# Patient Record
Sex: Male | Born: 1975 | Race: White | Hispanic: No | Marital: Single | Smoking: Never smoker
Health system: Southern US, Community
[De-identification: ages and names within clinical notes are randomized; demographics above are authoritative.]

## PROBLEM LIST (undated history)

## (undated) DIAGNOSIS — F32A Depression, unspecified: Secondary | ICD-10-CM

## (undated) DIAGNOSIS — F329 Major depressive disorder, single episode, unspecified: Secondary | ICD-10-CM

## (undated) HISTORY — PX: NASAL SEPTUM SURGERY: SHX37

## (undated) HISTORY — DX: Depression, unspecified: F32.A

## (undated) HISTORY — DX: Major depressive disorder, single episode, unspecified: F32.9

---

## 1998-12-23 ENCOUNTER — Ambulatory Visit: Admission: RE | Admit: 1998-12-23 | Discharge: 1998-12-23 | Payer: Self-pay | Admitting: Otolaryngology

## 1999-01-29 ENCOUNTER — Encounter: Payer: Self-pay | Admitting: Orthopedic Surgery

## 1999-01-29 ENCOUNTER — Inpatient Hospital Stay (HOSPITAL_COMMUNITY): Admission: EM | Admit: 1999-01-29 | Discharge: 1999-01-30 | Payer: Self-pay

## 1999-05-06 ENCOUNTER — Inpatient Hospital Stay (HOSPITAL_COMMUNITY): Admission: RE | Admit: 1999-05-06 | Discharge: 1999-05-08 | Payer: Self-pay | Admitting: Otolaryngology

## 2006-06-22 ENCOUNTER — Emergency Department (HOSPITAL_COMMUNITY): Admission: EM | Admit: 2006-06-22 | Discharge: 2006-06-23 | Payer: Self-pay | Admitting: Emergency Medicine

## 2016-06-29 ENCOUNTER — Ambulatory Visit (INDEPENDENT_AMBULATORY_CARE_PROVIDER_SITE_OTHER): Payer: BLUE CROSS/BLUE SHIELD

## 2016-06-29 ENCOUNTER — Ambulatory Visit (INDEPENDENT_AMBULATORY_CARE_PROVIDER_SITE_OTHER): Payer: BLUE CROSS/BLUE SHIELD | Admitting: Family Medicine

## 2016-06-29 VITALS — BP 138/88 | HR 65 | Temp 98.3°F | Resp 16 | Ht 69.0 in | Wt 192.0 lb

## 2016-06-29 DIAGNOSIS — F330 Major depressive disorder, recurrent, mild: Secondary | ICD-10-CM | POA: Diagnosis not present

## 2016-06-29 DIAGNOSIS — M5442 Lumbago with sciatica, left side: Secondary | ICD-10-CM | POA: Diagnosis not present

## 2016-06-29 DIAGNOSIS — M7918 Myalgia, other site: Secondary | ICD-10-CM

## 2016-06-29 DIAGNOSIS — M791 Myalgia: Secondary | ICD-10-CM | POA: Diagnosis not present

## 2016-06-29 MED ORDER — CYCLOBENZAPRINE HCL 10 MG PO TABS: 10.0000 mg | ORAL_TABLET | Freq: Three times a day (TID) | ORAL | 0 refills | Status: AC | PRN

## 2016-06-29 MED ORDER — BUPROPION HCL ER (SR) 100 MG PO TB12: 100.0000 mg | ORAL_TABLET | Freq: Every day | ORAL | 1 refills | Status: AC

## 2016-06-29 NOTE — Progress Notes (Signed)
Mark Peterson is a 41 y.o. male who presents to Primary Care at [facility] today for refill for depression medicine:  1.  Depression:  Patient has long-standing history of depression. He is followed by [facility] family practice. He ran out of his depression medications and called but was required to make an appointment. His first appointment would be sometime in mid April. His last dose of Wellbutrin which has been on for at least the past 10 years was 2 days ago. He began experiencing some "jitteriness" as well as insomnia and just general malaise and fatigue since not taking this medicine. He has had no seizure-like activity. No headaches or tremors. States that mood feels little worse but not that far from baseline. No suicidal or homicidal ideation.  2.  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 Had immediate sharp stabbing pain to left buttock and hamstring. This is persisted since that time. He does occasionally have burning sensation that travels down the back of his leg. He has no weakness. His continued exercise but does not drink any more heavy lifting. Pain is reproduced with Valsalva maneuvers or squats. No paresthesias in right leg. No pain on right side. No falls.  ROS as above.   PMH reviewed. Patient is a nonsmoker.   Past Medical History:  Diagnosis Date  . Depression    Past Surgical History:  Procedure Laterality Date  . NASAL SEPTUM SURGERY      Medications reviewed. Current Outpatient Prescriptions  Medication Sig Dispense Refill  . buPROPion (WELLBUTRIN SR) 100 MG 12 hr tablet Take 100 mg by mouth daily.     No current facility-administered medications for this visit.      Physical Exam:  BP 138/88   Pulse 65   Temp 98.3 F (36.8 C) (Oral)   Resp 16   Ht 5\' 9"  (1.753 m)   Wt 192 lb (87.1 kg)   SpO2 98%   BMI 28.35 kg/m  Gen:  Alert, cooperative patient who  appears stated age in no acute distress.  Vital signs reviewed. HEENT: EOMI,  MMM Pulm:  Clear to auscultation bilaterally with good air movement.  No wheezes or rales noted.   Cardiac:  Regular rate and rhythm without murmur auscultated.  Good S1/S2. Back:  Normal skin, Spine with normal alignment and no deformity.  No tenderness to vertebral process palpation.  Pain is directly over Left buttock in S1 region.  Range of motion is full at neck and lumbar sacral regions, but still has some pain with forward flexion.  Straight leg raise is positive mildly on Left.   Neuro:  Sensation and motor function 5/5 bilateral lower extremities.   Psych:  Not depressed or anxious appearing.  Linear and coherent thought process as evidenced by speech pattern. Smiles spontaneously.    Assessment and Plan:  1.  Depression:   - controlled with bupropion - already has FU appt scheduled with PCP to continue with regular bupropion usage and depression management.  - I have given him enough to make it to that appoint  2.  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  Desires referral to sports medicine doctor.  Will place today. - in meantime,  treat with NSAIDs and muscle relaxer

## 2016-06-29 NOTE — Patient Instructions (Addendum)
It was good to meet you today.   Take the Flexeril every 6 - 8 hours for pain relief.    I will refer you to sports medicine for a better diagnosis for your pain. Structurally, things look good.  I have refilled your welbutrin.    Have a good rest of the week!   IF you received an x-ray today, [facility] will receive an [facility]. Please contact [facility] at [phone] with questions or concerns regarding your invoice.   IF you received labwork today, you will receive an invoice from [facility]. Please contact [facility] at [phone] with questions or concerns regarding your invoice.   Our billing staff will not be able to assist you with questions regarding bills from these companies.  You will be contacted with the lab results as soon as they are available. The fastest way to get your results is to activate your My Chart account. Instructions are located on the last page of this paperwork. If you have not heard from us regarding the results in 2 weeks, please contact this office.

## 2017-09-07 IMAGING — DX DG LUMBAR SPINE 2-3V
3 series · 3 of 3 positions shown · non-contrast
Comparison: None

CLINICAL DATA: Acute left lower back pain and left sciatica. Felt a
pop in back.

EXAM:
LUMBAR SPINE - 2-3 VIEW

[l-spine ap]
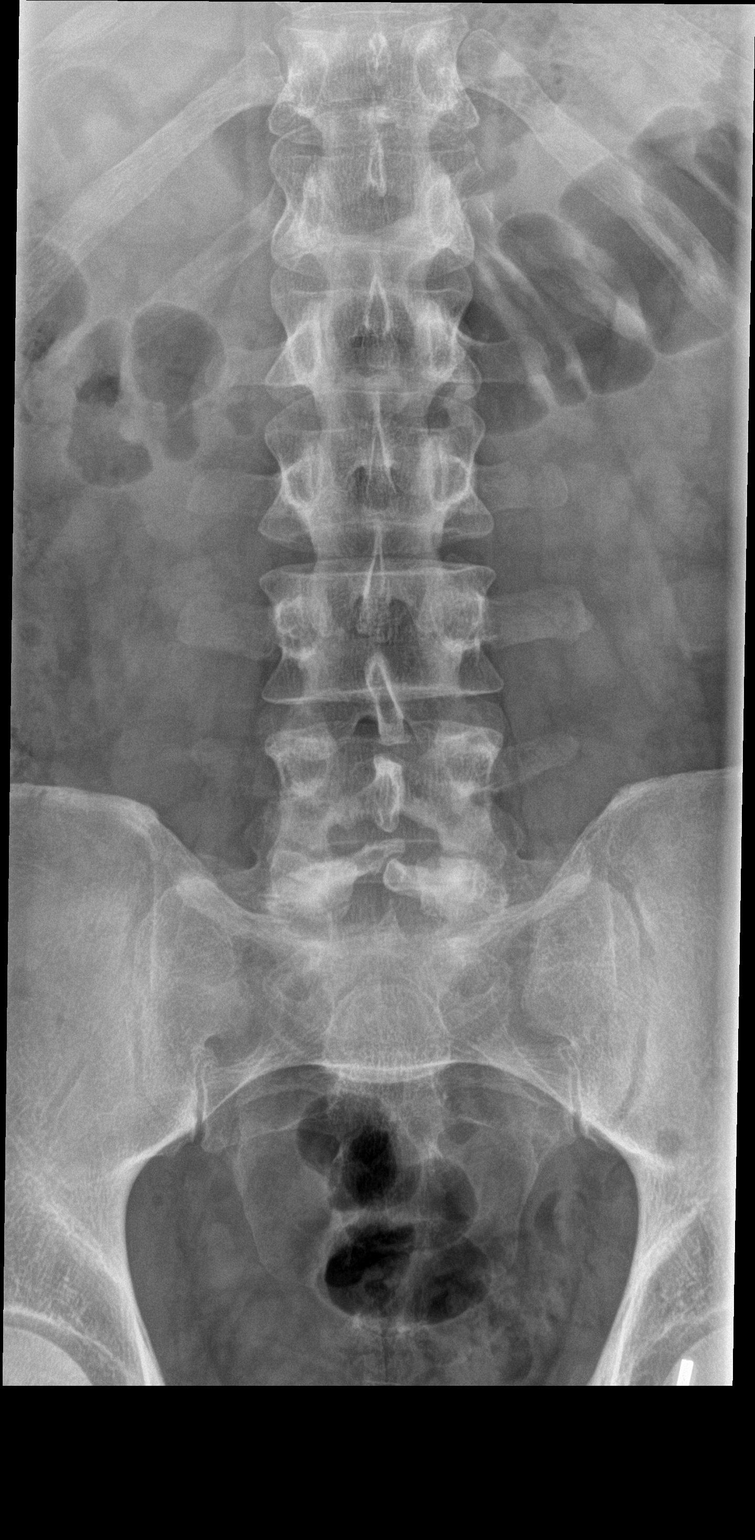

[l-spine lat]
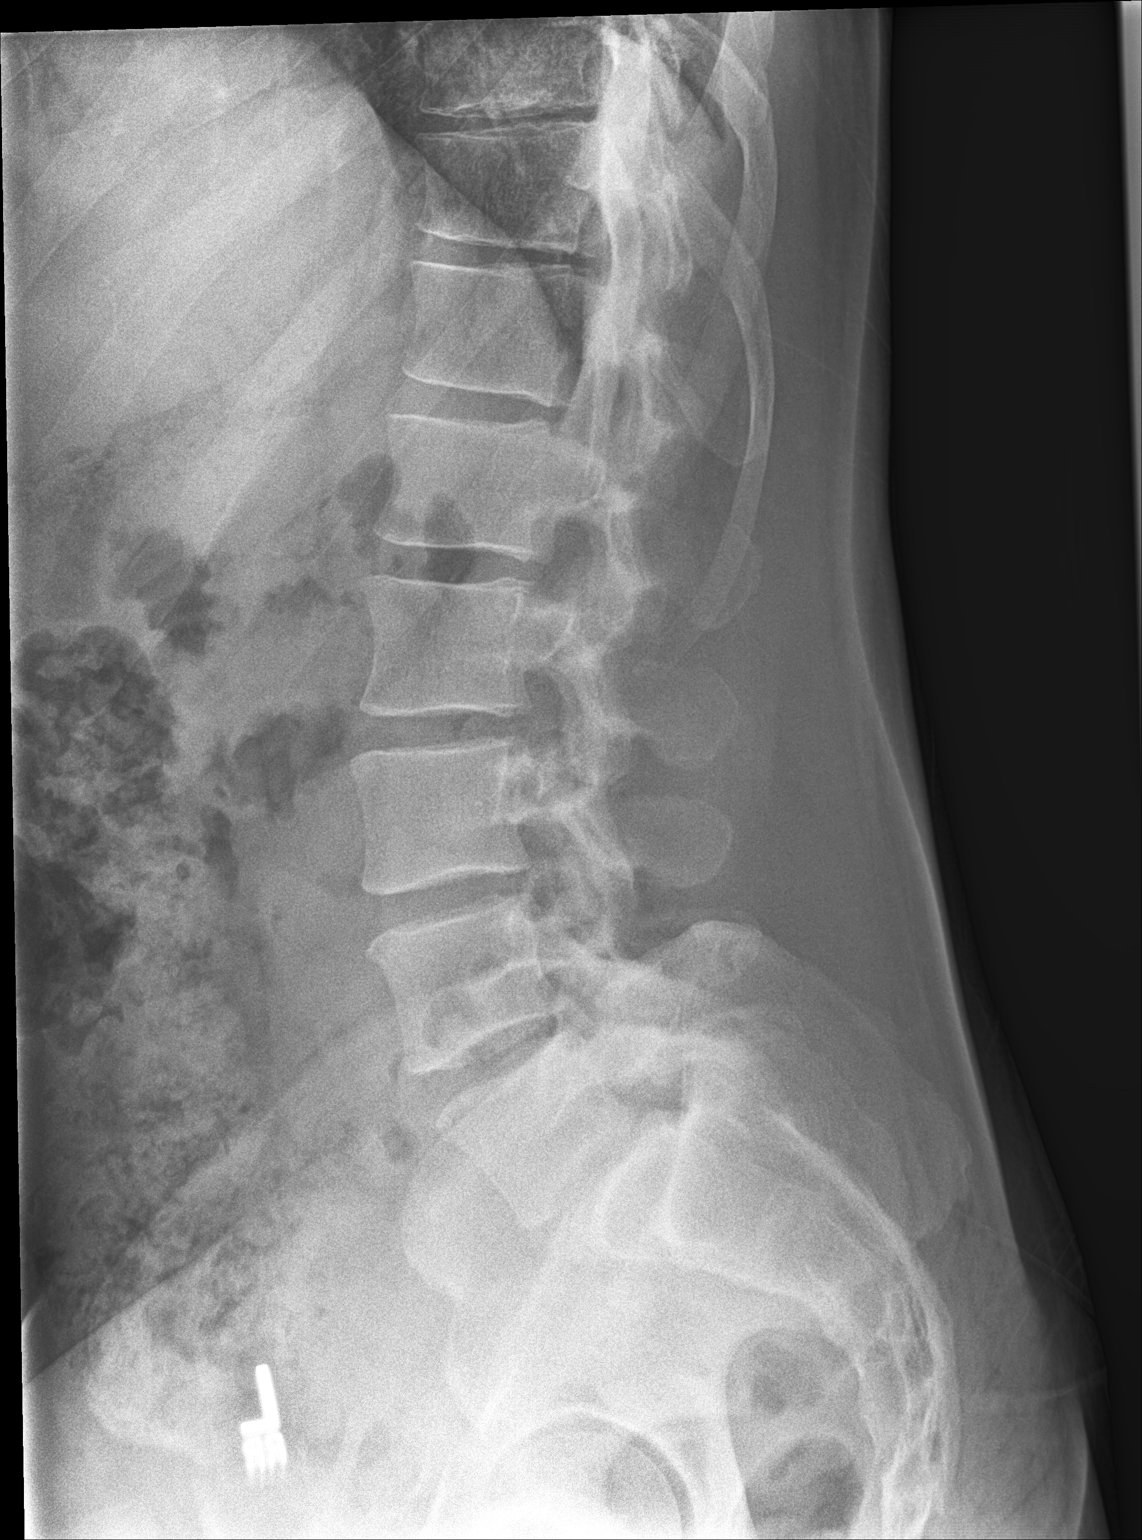

[l-spine l5-s1]
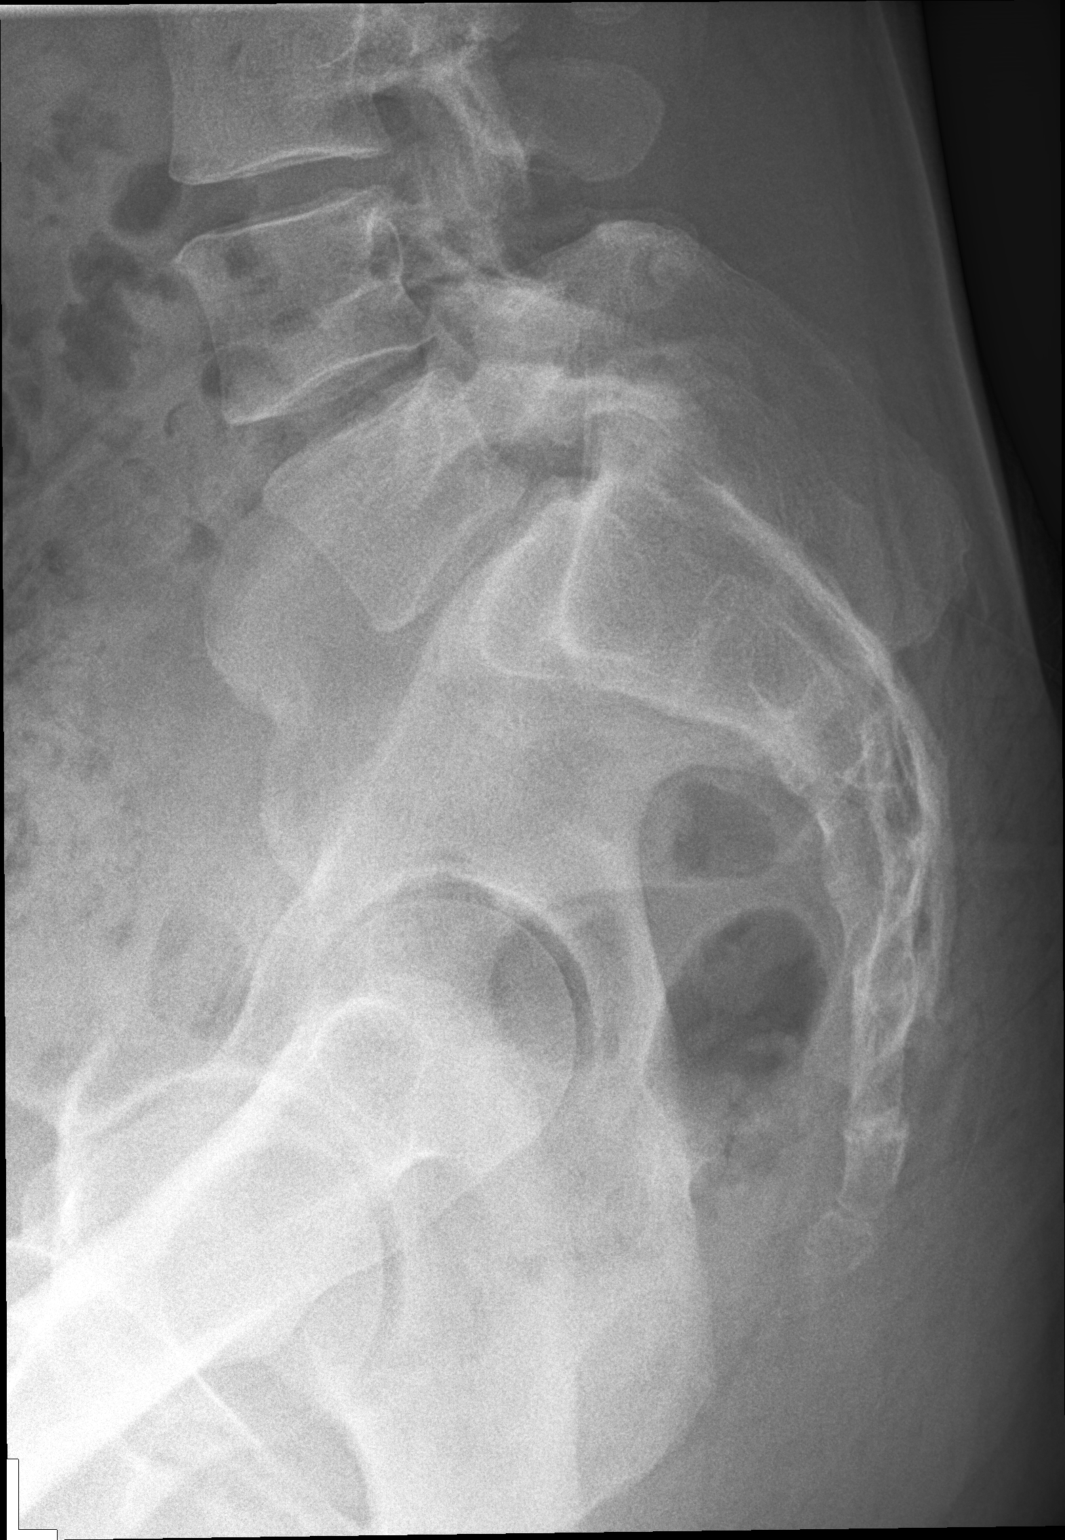

[3 of 3 positions shown; findings below may reference images not displayed]

FINDINGS: Slight anterolisthesis of L5 on S1, possibly related to pars defects
although these are not well visualized. Disc spaces are maintained.
No acute fracture. SI joints are symmetric and unremarkable.
IMPRESSION: Slight anterolisthesis of L5 on S1, possibly related to pars
defects.

No acute bony abnormality.
